# Patient Record
Sex: Female | Born: 1990 | Race: Black or African American | Hispanic: No | Marital: Single | Smoking: Never smoker
Health system: Southern US, Community
[De-identification: ages and names within clinical notes are randomized; demographics above are authoritative.]

## PROBLEM LIST (undated history)

## (undated) HISTORY — PX: HERNIA REPAIR: SHX51

---
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
## 2014-10-23 ENCOUNTER — Encounter (HOSPITAL_COMMUNITY): Payer: Self-pay | Admitting: *Deleted

## 2014-10-23 ENCOUNTER — Emergency Department (HOSPITAL_COMMUNITY): Payer: Commercial Managed Care - HMO

## 2014-10-23 ENCOUNTER — Emergency Department (HOSPITAL_COMMUNITY)
Admission: EM | Admit: 2014-10-23 | Discharge: 2014-10-24 | Disposition: A | Discharge status: Home / Self Care | Payer: Commercial Managed Care - HMO | Attending: Emergency Medicine | Admitting: Emergency Medicine

## 2014-10-23 DIAGNOSIS — R05 Cough: Secondary | ICD-10-CM | POA: Diagnosis not present

## 2014-10-23 DIAGNOSIS — R079 Chest pain, unspecified: Secondary | ICD-10-CM | POA: Diagnosis present

## 2014-10-23 DIAGNOSIS — R0789 Other chest pain: Secondary | ICD-10-CM | POA: Diagnosis not present

## 2014-10-23 NOTE — ED Provider Notes (Signed)
CSN: [id number]     Arrival date & time 10/23/14  2258 History   First MD Initiated Contact with Patient 10/23/14 2300     Chief Complaint  Patient presents with  . Chest Pain     (Consider location/radiation/quality/duration/timing/severity/associated sxs/prior Treatment) Patient is a 24 y.o. female presenting with chest pain. The history is provided by the patient.  Chest Pain Pain location:  Substernal area Pain quality: sharp   Pain severity:  Moderate Onset quality:  Gradual Duration:  1 day Timing:  Intermittent Progression:  Waxing and waning Chronicity:  New Context: breathing and movement   Relieved by:  Nothing Worsened by:  Coughing and movement Ineffective treatments:  None tried Associated symptoms: cough   Associated symptoms: no abdominal pain, no fever, no near-syncope and no syncope   Risk factors: no coronary artery disease, no diabetes mellitus, no prior DVT/PE, no smoking and no surgery     No past medical history on file. No past surgical history on file. No family history on file. Social History  Substance Use Topics  . Smoking status: Not on file  . Smokeless tobacco: Not on file  . Alcohol Use: Not on file   OB History    No data available     Review of Systems  Constitutional: Negative for fever.  Respiratory: Positive for cough.   Cardiovascular: Positive for chest pain. Negative for syncope and near-syncope.  Gastrointestinal: Negative for abdominal pain.  All other systems reviewed and are negative.     Allergies  Review of patient's allergies indicates not on file.  Home Medications   Prior to Admission medications   Not on File   There were no vitals taken for this visit. Physical Exam  Constitutional: She is oriented to person, place, and time. She appears well-developed and well-nourished.  Non-toxic appearance.  HENT:  Head: Normocephalic.  Right Ear: Tympanic membrane and external ear normal.  Left Ear: Tympanic  membrane and external ear normal.  Eyes: EOM and lids are normal. Pupils are equal, round, and reactive to light.  Neck: Normal range of motion. Neck supple. Carotid bruit is not present.  Cardiovascular: Normal rate, regular rhythm, normal heart sounds, intact distal pulses and normal pulses.   Pulmonary/Chest: Breath sounds normal. No respiratory distress. She exhibits tenderness. She exhibits no crepitus, no deformity and no retraction.    Abdominal: Soft. Bowel sounds are normal. There is no tenderness. There is no guarding.  Musculoskeletal: Normal range of motion.  Lymphadenopathy:       Head (right side): No submandibular adenopathy present.       Head (left side): No submandibular adenopathy present.    She has no cervical adenopathy.  Neurological: She is alert and oriented to person, place, and time. She has normal strength. No cranial nerve deficit or sensory deficit.  Skin: Skin is warm and dry.  Psychiatric: She has a normal mood and affect. Her speech is normal.  Nursing note and vitals reviewed.   ED Course  Procedures (including critical care time) Labs Review Labs Reviewed - No data to display  Imaging Review No results found. I have personally reviewed and evaluated these images and lab results as part of my medical decision-making.   EKG Interpretation None      MDM  Vital signs stable. EKG non-acute. Pain reproduced by range of motion exercises involving the pectoral muscles, as well as palpation of the left upper chest extending to the sternal border. 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 Suspect that the patient has a musculoskeletal chest pain. Prescription for Robaxin and ibuprofen given to the patient. I have given the patient a work excuse for couple of days in order for her to take some of her medications.    Final diagnoses:  Pain, chest wall    **I have  reviewed nursing notes, vital signs, and all appropriate lab and imaging results for this patient.Ivery Quale, PA-C 10/24/14 0115  Dione Booze, MD 10/24/14 [phone]

## 2014-10-23 NOTE — ED Notes (Signed)
Pt c/o mid sternal chest pain, described as sharp that has gotten worse today; pt states she also has a non-productive cough

## 2014-10-24 MED ORDER — IBUPROFEN 800 MG PO TABS: 800.0000 mg | ORAL_TABLET | Freq: Three times a day (TID) | ORAL | Status: DC

## 2014-10-24 MED ORDER — IBUPROFEN 800 MG PO TABS
800.0000 mg | ORAL_TABLET | Freq: Once | ORAL | Status: AC
  Administered 2014-10-24: 800 mg via ORAL
  Filled 2014-10-24: qty 1

## 2014-10-24 MED ORDER — ACETAMINOPHEN 325 MG PO TABS
650.0000 mg | ORAL_TABLET | Freq: Once | ORAL | Status: AC
  Administered 2014-10-24: 650 mg via ORAL
  Filled 2014-10-24: qty 2

## 2014-10-24 MED ORDER — METHOCARBAMOL 500 MG PO TABS
1000.0000 mg | ORAL_TABLET | Freq: Once | ORAL | Status: AC
  Administered 2014-10-24: 1000 mg via ORAL
  Filled 2014-10-24: qty 2

## 2014-10-24 MED ORDER — METHOCARBAMOL 500 MG PO TABS: 500.0000 mg | ORAL_TABLET | Freq: Three times a day (TID) | ORAL | Status: DC

## 2014-10-24 NOTE — Discharge Instructions (Signed)
Chest Wall Pain °Chest wall pain is pain felt in or around the chest bones and muscles. It may take up to 6 weeks to get better. It may take longer if you are active. Chest wall pain can happen on its own. Other times, things like germs, injury, coughing, or exercise can cause the pain. °HOME CARE  °· Avoid activities that make you tired or cause pain. Try not to use your chest, belly (abdominal), or side muscles. Do not use heavy weights. °· Put ice on the sore area. °¨ Put ice in a plastic bag. °¨ Place a towel between your skin and the bag. °¨ Leave the ice on for 15-20 minutes for the first 2 days. °· Only take medicine as told by your doctor. °GET HELP RIGHT AWAY IF:  °· You have more pain or are very uncomfortable. °· You have a fever. °· Your chest pain gets worse. °· You have new problems. °· You feel sick to your stomach (nauseous) or throw up (vomit). °· You start to sweat or feel lightheaded. °· You have a cough with mucus (phlegm). °· You cough up blood. °MAKE SURE YOU:  °· Understand these instructions. °· Will watch your condition. °· Will get help right away if you are not doing well or get worse. °Document Released: 07/01/2007 Document Revised: 04/06/2011 Document Reviewed: 09/08/2010 °[employer]® Patient Information ©2015 [employer]. This information is not intended to replace advice given to you by your health care provider. Make sure you discuss any questions you have with your health care provider. ° °

## 2014-10-24 NOTE — ED Notes (Signed)
Pt assisted to bathroom and back to bed 

## 2017-04-10 ENCOUNTER — Emergency Department (HOSPITAL_COMMUNITY)
Admission: EM | Admit: 2017-04-10 | Discharge: 2017-04-10 | Disposition: A | Discharge status: Home / Self Care | Payer: Commercial Managed Care - HMO | Attending: Emergency Medicine | Admitting: Emergency Medicine

## 2017-04-10 ENCOUNTER — Encounter (HOSPITAL_COMMUNITY): Payer: Self-pay

## 2017-04-10 DIAGNOSIS — L509 Urticaria, unspecified: Secondary | ICD-10-CM | POA: Insufficient documentation

## 2017-04-10 DIAGNOSIS — Z5321 Procedure and treatment not carried out due to patient leaving prior to being seen by health care provider: Secondary | ICD-10-CM | POA: Insufficient documentation

## 2017-04-10 NOTE — ED Notes (Signed)
Pt left the waiting area after another family member that was in the emergency department for treatment was also discharged.  Unable to locate pt at this time.

## 2017-04-10 NOTE — ED Triage Notes (Signed)
Pt with onset of hives to her chest, arms, and abd yesterday, states she does not know what might be causing it.  Pt took a benadryl earlier in the day.

## 2017-04-11 ENCOUNTER — Emergency Department (HOSPITAL_COMMUNITY)
Admission: EM | Admit: 2017-04-11 | Discharge: 2017-04-11 | Disposition: A | Discharge status: Home / Self Care | Payer: Self-pay

## 2017-04-11 NOTE — ED Notes (Signed)
Called no answer

## 2017-11-27 ENCOUNTER — Other Ambulatory Visit: Payer: Self-pay

## 2017-11-27 ENCOUNTER — Encounter (HOSPITAL_COMMUNITY): Payer: Self-pay | Admitting: Emergency Medicine

## 2017-11-27 ENCOUNTER — Emergency Department (HOSPITAL_COMMUNITY)
Admission: EM | Admit: 2017-11-27 | Discharge: 2017-11-27 | Disposition: A | Discharge status: Home / Self Care | Payer: Self-pay | Attending: Emergency Medicine | Admitting: Emergency Medicine

## 2017-11-27 DIAGNOSIS — Z79899 Other long term (current) drug therapy: Secondary | ICD-10-CM | POA: Insufficient documentation

## 2017-11-27 DIAGNOSIS — N390 Urinary tract infection, site not specified: Secondary | ICD-10-CM | POA: Insufficient documentation

## 2017-11-27 LAB — URINALYSIS, ROUTINE W REFLEX MICROSCOPIC
Bilirubin Urine: NEGATIVE
GLUCOSE, UA: NEGATIVE mg/dL
Hgb urine dipstick: NEGATIVE
Ketones, ur: NEGATIVE mg/dL
Nitrite: NEGATIVE
PROTEIN: NEGATIVE mg/dL
Specific Gravity, Urine: 1.019 (ref 1.005–1.030)
pH: 5 (ref 5.0–8.0)

## 2017-11-27 LAB — POC URINE PREG, ED: Preg Test, Ur: NEGATIVE

## 2017-11-27 MED ORDER — METHOCARBAMOL 500 MG PO TABS: 500.0000 mg | ORAL_TABLET | Freq: Two times a day (BID) | ORAL | 0 refills | Status: DC

## 2017-11-27 MED ORDER — CEPHALEXIN 500 MG PO CAPS: 500.0000 mg | ORAL_CAPSULE | Freq: Two times a day (BID) | ORAL | 0 refills | Status: DC

## 2017-11-27 NOTE — ED Triage Notes (Signed)
Patient c/o bilateral flank pain x1 week. Per patient pain started radiating into lower back this morning. Dysuria and frequency in urination reported. Denies any nausea, vomiting, diarrhea, fevers, or blood in urine. Denies any complications with BMs-last normal BM yesterday.

## 2017-11-27 NOTE — Discharge Instructions (Addendum)
Please read attached information. If you experience any new or worsening signs or symptoms please return to the emergency room for evaluation. Please follow-up with your primary care provider or specialist as discussed. Please use medication prescribed only as directed and discontinue taking if you have any concerning signs or symptoms.   °

## 2017-11-27 NOTE — ED Provider Notes (Signed)
[facility] EMERGENCY DEPARTMENT Provider Note   CSN: [id number] Arrival date & time: 11/27/17  1018     History   Chief Complaint Chief Complaint  Patient presents with  . Flank Pain    HPI Jenna Brennan is a 27 y.o. female.  HPI   27 year old female presents today with complaints of bilateral back and  flank pain.  Patient notes symptoms started early last week approximately 5 days ago with bilateral pain in her flank and lower back.  She notes symptoms are worse with palpation, worse with movement.  She denies any cough shortness of breath abdominal pain fever or abnormal bowel movements.  She notes she has had some increased urinary frequency recently with some dysuria.   History reviewed. No pertinent past medical history.  There are no active problems to display for this patient.   Past Surgical History:  Procedure Laterality Date  . HERNIA REPAIR       OB History    Gravida  0   Para  0   Term  0   Preterm  0   AB  0   Living  0     SAB  0   TAB  0   Ectopic  0   Multiple  0   Live Births  0            Home Medications    Prior to Admission medications   Medication Sig Start Date End Date Taking? Authorizing Provider  cephALEXin (KEFLEX) 500 MG capsule Take 1 capsule (500 mg total) by mouth 2 (two) times daily. 11/27/17   Maurissa Ambrose, Tinnie Gens, PA-C  ibuprofen (ADVIL,MOTRIN) 800 MG tablet Take 1 tablet (800 mg total) by mouth 3 (three) times daily. 10/24/14   Ivery Quale, PA-C  methocarbamol (ROBAXIN) 500 MG tablet Take 1 tablet (500 mg total) by mouth 2 (two) times daily. 11/27/17   Eyvonne Mechanic, PA-C    Family History History reviewed. No pertinent family history.  Social History Social History   Tobacco Use  . Smoking status: Never Smoker  . Smokeless tobacco: Never Used  Substance Use Topics  . Alcohol use: No  . Drug use: No     Allergies   Patient has no known allergies.   Review of Systems Review of Systems    All other systems reviewed and are negative.   Physical Exam Updated Vital Signs BP 124/84 (BP Location: Right Arm)   Pulse 94   Temp 99 F (37.2 C) (Oral)   Resp 16   Ht 5\' 6"  (1.676 m)   Wt 108.9 kg   LMP 11/02/2017   SpO2 100%   BMI 38.74 kg/m   Physical Exam  Constitutional: She is oriented to person, place, and time. She appears well-developed and well-nourished.  HENT:  Head: Normocephalic and atraumatic.  Eyes: Pupils are equal, round, and reactive to light. Conjunctivae are normal. Right eye exhibits no discharge. Left eye exhibits no discharge. No scleral icterus.  Neck: Normal range of motion. No JVD present. No tracheal deviation present.  Pulmonary/Chest: Effort normal. No stridor.  Abdominal: Soft. She exhibits no distension and no mass. There is no tenderness. There is no rebound and no guarding. No hernia.  Musculoskeletal:  Exquisite tenderness to palpation of bilateral mid thoracic to lower lumbar musculature and flanks, no rash-no midline tenderness  Neurological: She is alert and oriented to person, place, and time. Coordination normal.  Psychiatric: She has a normal mood and affect. Her behavior is  normal. Judgment and thought content normal.  Nursing note and vitals reviewed.    ED Treatments / Results  Labs (all labs ordered are listed, but only abnormal results are displayed) Labs Reviewed  URINALYSIS, ROUTINE W REFLEX MICROSCOPIC - Abnormal; Notable for the following components:      Result Value   Leukocytes, UA SMALL (*)    Bacteria, UA RARE (*)    All other components within normal limits  POC URINE PREG, ED    EKG None  Radiology No results found.  Procedures Procedures (including critical care time)  Medications Ordered in ED Medications - No data to display   Initial Impression / Assessment and Plan / ED Course  I have reviewed the triage vital signs and the nursing notes.  Pertinent labs & imaging results that were  available during my care of the patient were reviewed by me and considered in my medical decision making (see chart for details).     Discharge Meds: Flexeril, Robaxin  Assessment/Plan: 27 year old female presents today with likely muscular back pain.  She has exquisite tenderness to even light palpation of the thoracic to lumbar musculature.  She has no abdominal tenderness fever nausea or vomiting.  She has very minimal urinary symptoms with findings on urinalysis showing potential UTI.  Patient will be covered for uncomplicated UTI have very low suspicion for any signs of pyelonephritis.  If patient develops any new or worsening signs or symptoms she will return to emergency room for repeat evaluation.  She will take ibuprofen and Tylenol as needed for discomfort.    Final Clinical Impressions(s) / ED Diagnoses   Final diagnoses:  Urinary tract infection without hematuria, site unspecified    ED Discharge Orders         Ordered    cephALEXin (KEFLEX) 500 MG capsule  2 times daily     11/27/17 1137    methocarbamol (ROBAXIN) 500 MG tablet  2 times daily     11/27/17 1140           Eyvonne Mechanic, PA-C 11/27/17 1140    Bethann Berkshire, MD 11/27/17 1322

## 2017-12-13 ENCOUNTER — Emergency Department (HOSPITAL_COMMUNITY)
Admission: EM | Admit: 2017-12-13 | Discharge: 2017-12-13 | Disposition: A | Discharge status: Home / Self Care | Payer: Self-pay | Attending: Emergency Medicine | Admitting: Emergency Medicine

## 2017-12-13 ENCOUNTER — Emergency Department (HOSPITAL_COMMUNITY): Payer: Self-pay

## 2017-12-13 ENCOUNTER — Encounter (HOSPITAL_COMMUNITY): Payer: Self-pay | Admitting: Emergency Medicine

## 2017-12-13 ENCOUNTER — Other Ambulatory Visit: Payer: Self-pay

## 2017-12-13 DIAGNOSIS — K5900 Constipation, unspecified: Secondary | ICD-10-CM

## 2017-12-13 DIAGNOSIS — R072 Precordial pain: Secondary | ICD-10-CM

## 2017-12-13 LAB — HEPATIC FUNCTION PANEL
ALT: 18 U/L (ref 0–44)
AST: 21 U/L (ref 15–41)
Albumin: 4.2 g/dL (ref 3.5–5.0)
Alkaline Phosphatase: 83 U/L (ref 38–126)
Bilirubin, Direct: 0.1 mg/dL (ref 0.0–0.2)
Indirect Bilirubin: 0.5 mg/dL (ref 0.3–0.9)
TOTAL PROTEIN: 8.4 g/dL — AB (ref 6.5–8.1)
Total Bilirubin: 0.6 mg/dL (ref 0.3–1.2)

## 2017-12-13 LAB — LIPASE, BLOOD: Lipase: 42 U/L (ref 11–51)

## 2017-12-13 LAB — BASIC METABOLIC PANEL
Anion gap: 8 (ref 5–15)
BUN: 12 mg/dL (ref 6–20)
CO2: 23 mmol/L (ref 22–32)
Calcium: 9.2 mg/dL (ref 8.9–10.3)
Chloride: 106 mmol/L (ref 98–111)
Creatinine, Ser: 0.94 mg/dL (ref 0.44–1.00)
GFR calc Af Amer: >60 mL/min (ref >60)
GFR calc non Af Amer: >60 mL/min (ref >60)
Glucose, Bld: 114 mg/dL — ABNORMAL HIGH (ref 70–99)
POTASSIUM: 3.6 mmol/L (ref 3.5–5.1)
SODIUM: 137 mmol/L (ref 135–145)

## 2017-12-13 LAB — CBC
HEMATOCRIT: 43.4 % (ref 36.0–46.0)
Hemoglobin: 13.3 g/dL (ref 12.0–15.0)
MCH: 25.6 pg — ABNORMAL LOW (ref 26.0–34.0)
MCHC: 30.6 g/dL (ref 30.0–36.0)
MCV: 83.6 fL (ref 80.0–100.0)
NRBC: 0 % (ref 0.0–0.2)
Platelets: 399 10*3/uL (ref 150–400)
RBC: 5.19 MIL/uL — ABNORMAL HIGH (ref 3.87–5.11)
RDW: 15.3 % (ref 11.5–15.5)
WBC: 16.5 10*3/uL — AB (ref 4.0–10.5)

## 2017-12-13 LAB — POC URINE PREG, ED: PREG TEST UR: NEGATIVE

## 2017-12-13 LAB — TROPONIN I: Troponin I: <0.03 ng/mL (ref <0.03)

## 2017-12-13 MED ORDER — LIDOCAINE VISCOUS HCL 2 % MT SOLN
15.0000 mL | Freq: Once | OROMUCOSAL | Status: AC
  Administered 2017-12-13: 15 mL via ORAL
  Filled 2017-12-13: qty 15

## 2017-12-13 MED ORDER — POLYETHYLENE GLYCOL 3350 17 G PO PACK: 17.0000 g | PACK | Freq: Every day | ORAL | 0 refills | Status: DC

## 2017-12-13 MED ORDER — ALUM & MAG HYDROXIDE-SIMETH 200-200-20 MG/5ML PO SUSP
30.0000 mL | Freq: Once | ORAL | Status: AC
  Administered 2017-12-13: 30 mL via ORAL
  Filled 2017-12-13: qty 30

## 2017-12-13 MED ORDER — SENNOSIDES-DOCUSATE SODIUM 8.6-50 MG PO TABS: 1.0000 | ORAL_TABLET | Freq: Every evening | ORAL | 0 refills | Status: DC | PRN

## 2017-12-13 NOTE — ED Provider Notes (Signed)
Emergency Department Provider Note   I have reviewed the triage vital signs and the nursing notes.   HISTORY  Chief Complaint Chest Pain   HPI Jenna Brennan is a 27 y.o. female resents to the emergency department with chest and epigastric abdominal pain over the past month.  Patient reports symptoms worse with eating or lying flat.  Pain radiates to the back.  She is had some associated constipation which has not been issue for her in the past.  Patient tells me that she has been massaging her abdomen and drinking water with no relief and constipation.  No rectal or abdominal pain.  Denies any fevers or chills.  No productive cough or shortness of breath.   History reviewed. No pertinent past medical history.  There are no active problems to display for this patient.   Past Surgical History:  Procedure Laterality Date  . HERNIA REPAIR      Allergies Patient has no known allergies.  No family history on file.  Social History Social History   Tobacco Use  . Smoking status: Never Smoker  . Smokeless tobacco: Never Used  Substance Use Topics  . Alcohol use: No  . Drug use: No    Review of Systems  Constitutional: No fever/chills Eyes: No visual changes. ENT: No sore throat. Cardiovascular: Positive chest pain. Respiratory: Denies shortness of breath. Gastrointestinal: Positive epigastric abdominal pain.  No nausea, no vomiting.  No diarrhea. Positive constipation. Genitourinary: Negative for dysuria. Musculoskeletal: Negative for back pain. Skin: Negative for rash. Neurological: Negative for headaches, focal weakness or numbness.  10-point ROS otherwise negative.  ____________________________________________   PHYSICAL EXAM:  VITAL SIGNS: ED Triage Vitals  Enc Vitals Group     BP 12/13/17 1954 131/81     Pulse Rate 12/13/17 1954 85     Resp 12/13/17 1954 14     Temp 12/13/17 1954 98.5 F (36.9 C)     Temp Source 12/13/17 1954 Oral     SpO2  12/13/17 1954 95 %     Weight 12/13/17 1955 238 lb (108 kg)     Height 12/13/17 1955 5\' 6"  (1.676 m)     Pain Score 12/13/17 1955 8   Constitutional: Alert and oriented. Well appearing and in no acute distress. Eyes: Conjunctivae are normal.  Head: Atraumatic. Nose: No congestion/rhinnorhea. Mouth/Throat: Mucous membranes are moist. Neck: No stridor.   Cardiovascular: Normal rate, regular rhythm. Good peripheral circulation. Grossly normal heart sounds.   Respiratory: Normal respiratory effort.  No retractions. Lungs CTAB. Gastrointestinal: Soft and nontender. No distention.  Musculoskeletal: No lower extremity tenderness nor edema. No gross deformities of extremities. Neurologic:  Normal speech and language. No gross focal neurologic deficits are appreciated.  Skin:  Skin is warm, dry and intact. No rash noted.  ____________________________________________   LABS (all labs ordered are listed, but only abnormal results are displayed)  Labs Reviewed  BASIC METABOLIC PANEL - Abnormal; Notable for the following components:      Result Value   Glucose, Bld 114 (*)    All other components within normal limits  CBC - Abnormal; Notable for the following components:   WBC 16.5 (*)    RBC 5.19 (*)    MCH 25.6 (*)    All other components within normal limits  HEPATIC FUNCTION PANEL - Abnormal; Notable for the following components:   Total Protein 8.4 (*)    All other components within normal limits  TROPONIN I  LIPASE, BLOOD  POC  URINE PREG, ED   ____________________________________________  EKG   EKG Interpretation  Date/Time:  Monday December 13 2017 19:58:12 EST Ventricular Rate:  88 PR Interval:  178 QRS Duration: 80 QT Interval:  334 QTC Calculation: 404 R Axis:   49 Text Interpretation:  Normal sinus rhythm Normal ECG No STEMI.  Confirmed by Alona BeneLong, Thaddeus Evitts [id number]) on 12/14/2017 9:39:29 AM       ____________________________________________  RADIOLOGY  Dg Abdomen  Acute W/chest  Result Date: 12/13/2017 CLINICAL DATA:  Chest pain and epigastric pain for 1 month. No bowel movement for 2 weeks. EXAM: DG ABDOMEN ACUTE W/ 1V CHEST COMPARISON:  Chest radiograph October 18, 2017 FINDINGS: Cardiomediastinal silhouette is normal. Lungs are clear, no pleural effusions. No pneumothorax. Soft tissue planes and included osseous structures are unremarkable. Bowel gas pattern is nondilated and nonobstructive. Moderate volume retained large bowel stool. Phleboliths project in the pelvis. No intra-abdominal mass effect, pathologic calcifications or free air. Soft tissue planes and included osseous structures are non-suspicious. IMPRESSION: 1. Normal chest. 2. Moderate amount of retained large bowel stool, normal bowel gas pattern. Electronically Signed   By: Awilda Metroourtnay  Bloomer M.D.   On: 12/13/2017 22:11    ____________________________________________   PROCEDURES  Procedure(s) performed:   Procedures  None ____________________________________________   INITIAL IMPRESSION / ASSESSMENT AND PLAN / ED COURSE  Pertinent labs & imaging results that were available during my care of the patient were reviewed by me and considered in my medical decision making (see chart for details).  Patient presents to the emergency department with atypical chest and epigastric abdominal pain radiating to the back.  Suspect GI etiology of symptoms.  Patient has no right upper quadrant tenderness or peritoneal signs.  Remainder of the abdomen is completely nontender to palpation.  Symptoms have been ongoing for the past month.  Labs from triage reviewed.  Plan to add on hepatic and biliary labs as well as plain film of the chest and abdomen.   Biliary labs are normal. Plain film reviewed and correlates with my expectation clinically. No acute distress. Re-exam of the abdomen remains benign. Plan for discharge home with constipation treatment and PCP follow up.   At this time, I do not  feel there is any life-threatening condition present. I have reviewed and discussed all results (EKG, imaging, lab, urine as appropriate), exam findings with patient. I have reviewed nursing notes and appropriate previous records.  I feel the patient is safe to be discharged home without further emergent workup. Discussed usual and customary return precautions. Patient and family (if present) verbalize understanding and are comfortable with this plan.  Patient will follow-up with their primary care provider. If they do not have a primary care provider, information for follow-up has been provided to them. All questions have been answered.  ____________________________________________  FINAL CLINICAL IMPRESSION(S) / ED DIAGNOSES  Final diagnoses:  Precordial chest pain  Constipation, unspecified constipation type     MEDICATIONS GIVEN DURING THIS VISIT:  Medications  alum & mag hydroxide-simeth (MAALOX/MYLANTA) 200-200-20 MG/5ML suspension 30 mL (30 mLs Oral Given 12/13/17 2156)    And  lidocaine (XYLOCAINE) 2 % viscous mouth solution 15 mL (15 mLs Oral Given 12/13/17 2156)     NEW OUTPATIENT MEDICATIONS STARTED DURING THIS VISIT:  Discharge Medication List as of 12/13/2017 10:54 PM    START taking these medications   Details  polyethylene glycol (MIRALAX) packet Take 17 g by mouth daily., Starting Mon 12/13/2017, Print    senna-docusate (SENOKOT-S) 8.6-50 MG  tablet Take 1 tablet by mouth at bedtime as needed for moderate constipation., Starting Mon 12/13/2017, Print        Note:  This document was prepared using Dragon voice recognition software and may include unintentional dictation errors.  Alona Bene, MD Emergency Medicine    Zaynab Chipman, Arlyss Repress, MD 12/14/17 [phone]

## 2017-12-13 NOTE — Discharge Instructions (Signed)
You were seen in the emergency department today for constipation.  We recommend that you use one or more of the following over-the-counter medications in the order described: °  °1)  Colace (or Dulcolax) 100 mg:  This is a stool softener, and you may take it once or twice a day as needed. °2)  Senna tablets:  This is a bowel stimulant that will help "push" out your stool. It is the next step to add after you have tried a stool softener. °3)  Miralax (powder):  This medication works by drawing additional fluid into your intestines and helps to flush out your stool.  Mix the powder with water or juice according to label instructions.  It may help if the Colace and Senna are not sufficient, but you must be sure to use the recommended amount of water or juice when you mix up the powder. °Remember that narcotic pain medications are constipating, so avoid them or minimize their use.  Drink plenty of fluids. ° °Please return to the Emergency Department immediately if you develop new or worsening symptoms that concern you, such as (but not limited to) fever > 101 degrees, severe abdominal pain, or persistent vomiting. ° ° °Constipation °Constipation is when a person has fewer than three bowel movements a week, has difficulty having a bowel movement, or has stools that are dry, hard, or larger than normal. As people grow older, constipation is more common. If you try to fix constipation with medicines that make you have a bowel movement (laxatives), the problem may get worse. Rutha Melgoza-term laxative use may cause the muscles of the colon to become weak. A low-fiber diet, not taking in enough fluids, and taking certain medicines may make constipation worse.  °CAUSES  °Certain medicines, such as antidepressants, pain medicine, iron supplements, antacids, and water pills.   °Certain diseases, such as diabetes, irritable bowel syndrome (IBS), thyroid disease, or depression.   °Not drinking enough water.   °Not eating enough  fiber-rich foods.   °Stress or travel.   °Lack of physical activity or exercise.   °Ignoring the urge to have a bowel movement.   °Using laxatives too much.   °SIGNS AND SYMPTOMS  °Having fewer than three bowel movements a week.   °Straining to have a bowel movement.   °Having stools that are hard, dry, or larger than normal.   °Feeling full or bloated.   °Pain in the lower abdomen.   °Not feeling relief after having a bowel movement.   °DIAGNOSIS  °Your health care provider will take a medical history and perform a physical exam. Further testing may be done for severe constipation. Some tests may include: °A barium enema X-ray to examine your rectum, colon, and, sometimes, your small intestine.   °A sigmoidoscopy to examine your lower colon.   °A colonoscopy to examine your entire colon. °TREATMENT  °Treatment will depend on the severity of your constipation and what is causing it. Some dietary treatments include drinking more fluids and eating more fiber-rich foods. Lifestyle treatments may include regular exercise. If these diet and lifestyle recommendations do not help, your health care provider may recommend taking over-the-counter laxative medicines to help you have bowel movements. Prescription medicines may be prescribed if over-the-counter medicines do not work.  °HOME CARE INSTRUCTIONS  °Eat foods that have a lot of fiber, such as fruits, vegetables, whole grains, and beans. °Limit foods high in fat and processed sugars, such as french fries, hamburgers, cookies, candies, and soda.   °A   fiber supplement may be added to your diet if you cannot get enough fiber from foods.   °Drink enough fluids to keep your urine clear or pale yellow.   °Exercise regularly or as directed by your health care provider.   °Go to the restroom when you have the urge to go. Do not hold it.   °Only take over-the-counter or prescription medicines as directed by your health care provider. Do not take other medicines for constipation  without talking to your health care provider first.   °SEEK IMMEDIATE MEDICAL CARE IF:  °You have bright red blood in your stool.   °Your constipation lasts for more than 4 days or gets worse.   °You have abdominal or rectal pain.   °You have thin, pencil-like stools.   °You have unexplained weight loss. °MAKE SURE YOU:  °Understand these instructions. °Will watch your condition. °Will get help right away if you are not doing well or get worse. °Document Released: 10/11/2003 Document Revised: 01/17/2013 Document Reviewed: 10/24/2012 °[employer]® Patient Information ©2015 [employer]. This information is not intended to replace advice given to you by your health care provider. Make sure you discuss any questions you have with your health care provider. ° ° ° °

## 2017-12-13 NOTE — ED Triage Notes (Signed)
Pt C/O chest pain that started 1 month ago. Pt states it hurts worse after eating and lying flat. Pt also C/O mid back pain. Pt states she does not take birth control. Pt also C/O "not being able to have a bowel movement normally." Pt states last BM was 2 weeks ago.

## 2017-12-13 NOTE — ED Notes (Signed)
Pt ambulatory to waiting room. Pt verbalized understanding of discharge instructions.   

## 2018-02-27 ENCOUNTER — Emergency Department (HOSPITAL_COMMUNITY)
Admission: EM | Admit: 2018-02-27 | Discharge: 2018-02-27 | Disposition: A | Discharge status: Home / Self Care | Payer: Self-pay | Attending: Emergency Medicine | Admitting: Emergency Medicine

## 2018-02-27 ENCOUNTER — Emergency Department (HOSPITAL_COMMUNITY): Payer: Self-pay

## 2018-02-27 ENCOUNTER — Other Ambulatory Visit: Payer: Self-pay

## 2018-02-27 ENCOUNTER — Encounter (HOSPITAL_COMMUNITY): Payer: Self-pay | Admitting: *Deleted

## 2018-02-27 DIAGNOSIS — K0510 Chronic gingivitis, plaque induced: Secondary | ICD-10-CM

## 2018-02-27 DIAGNOSIS — R0789 Other chest pain: Secondary | ICD-10-CM

## 2018-02-27 DIAGNOSIS — K0501 Acute gingivitis, non-plaque induced: Secondary | ICD-10-CM | POA: Insufficient documentation

## 2018-02-27 LAB — TROPONIN I: Troponin I: <0.03 ng/mL (ref <0.03)

## 2018-02-27 MED ORDER — AMOXICILLIN 250 MG PO CAPS
500.0000 mg | ORAL_CAPSULE | Freq: Once | ORAL | Status: AC
  Administered 2018-02-27: 500 mg via ORAL
  Filled 2018-02-27: qty 2

## 2018-02-27 MED ORDER — AMOXICILLIN 500 MG PO CAPS: 500.0000 mg | ORAL_CAPSULE | Freq: Three times a day (TID) | ORAL | 0 refills | Status: DC

## 2018-02-27 NOTE — ED Provider Notes (Signed)
[facility] EMERGENCY DEPARTMENT Provider Note   CSN: [id number] Arrival date & time: 02/27/18  0606    History   Chief Complaint Chief Complaint  Patient presents with  . Chest Pain    HPI Jenna Brennan is a 28 y.o. female.  HPI  28 year old female, she has no past medical history, she presents today with 2 complaints, first she has had chest pain for the last 2 days which she describes as global chest pain, nonfocal, not positional and not associated with deep breathing or exertion.  She reports that it is been present for over 48 hours without any relief.  She had similar symptoms a couple of months ago when she was seen in the emergency department at that time as well.  She has no other medical history, she has no cardiac history she does not smoke cigarettes and she has no risk factors for pulmonary embolism.  She denies any exertional symptoms.  She feels like the pain is radiating up into her mouth today which is why she came to the hospital.  She has not had any medicine for this prior to arrival.  She states it hurts to open her mouth  History reviewed. No pertinent past medical history.  There are no active problems to display for this patient.   Past Surgical History:  Procedure Laterality Date  . HERNIA REPAIR       OB History    Gravida  0   Para  0   Term  0   Preterm  0   AB  0   Living  0     SAB  0   TAB  0   Ectopic  0   Multiple  0   Live Births  0            Home Medications    Prior to Admission medications   Medication Sig Start Date End Date Taking? Authorizing Provider  amoxicillin (AMOXIL) 500 MG capsule Take 1 capsule (500 mg total) by mouth 3 (three) times daily. 02/27/18   Eber Hong, MD  cephALEXin (KEFLEX) 500 MG capsule Take 1 capsule (500 mg total) by mouth 2 (two) times daily. 11/27/17   Hedges, Tinnie Gens, PA-C  ibuprofen (ADVIL,MOTRIN) 800 MG tablet Take 1 tablet (800 mg total) by mouth 3 (three) times daily. 10/24/14    Ivery Quale, PA-C  methocarbamol (ROBAXIN) 500 MG tablet Take 1 tablet (500 mg total) by mouth 2 (two) times daily. 11/27/17   Hedges, Tinnie Gens, PA-C  polyethylene glycol (MIRALAX) packet Take 17 g by mouth daily. 12/13/17   Long, Arlyss Repress, MD  senna-docusate (SENOKOT-S) 8.6-50 MG tablet Take 1 tablet by mouth at bedtime as needed for moderate constipation. 12/13/17   Long, Arlyss Repress, MD    Family History No family history on file.  Social History Social History   Tobacco Use  . Smoking status: Never Smoker  . Smokeless tobacco: Never Used  Substance Use Topics  . Alcohol use: No  . Drug use: No     Allergies   Patient has no known allergies.   Review of Systems Review of Systems  All other systems reviewed and are negative.    Physical Exam Updated Vital Signs BP 124/80   Pulse 97   Temp 98.3 F (36.8 C) (Oral)   Resp 20   Ht 1.676 m (5\' 6" )   Wt 105.2 kg   LMP 02/20/2018   SpO2 100%   BMI 37.45 kg/m  Physical Exam Vitals signs and nursing note reviewed.  Constitutional:      General: She is not in acute distress.    Appearance: She is well-developed.  HENT:     Head: Normocephalic and atraumatic.     Mouth/Throat:     Pharynx: No oropharyngeal exudate.     Comments: Dentition is overall in good repair except for the left upper first premolar which has a fracture and partially exposed root.  Her gingiva are tender across the upper gumline without any focal swelling.  There is no trismus or torticollis, no pain with tongue protrusion, no lymphadenopathy about the jaw or the neck Eyes:     General: No scleral icterus.       Right eye: No discharge.        Left eye: No discharge.     Conjunctiva/sclera: Conjunctivae normal.     Pupils: Pupils are equal, round, and reactive to light.  Neck:     Musculoskeletal: Normal range of motion and neck supple.     Thyroid: No thyromegaly.     Vascular: No JVD.  Cardiovascular:     Rate and Rhythm: Normal rate  and regular rhythm.     Heart sounds: Normal heart sounds. No murmur. No friction rub. No gallop.      Comments: There is no tenderness over the chest wall, there is no murmurs rubs or gallops, normal pulses at the radial arteries bilaterally Pulmonary:     Effort: Pulmonary effort is normal. No respiratory distress.     Breath sounds: Normal breath sounds. No wheezing or rales.  Abdominal:     General: Bowel sounds are normal. There is no distension.     Palpations: Abdomen is soft. There is no mass.     Tenderness: There is no abdominal tenderness.  Musculoskeletal: Normal range of motion.        General: No tenderness.     Right lower leg: No edema.     Left lower leg: No edema.  Lymphadenopathy:     Cervical: No cervical adenopathy.  Skin:    General: Skin is warm and dry.     Findings: No erythema or rash.  Neurological:     Mental Status: She is alert.     Coordination: Coordination normal.  Psychiatric:        Behavior: Behavior normal.      ED Treatments / Results  Labs (all labs ordered are listed, but only abnormal results are displayed) Labs Reviewed  TROPONIN I    EKG EKG Interpretation  Date/Time:  Sunday February 27 2018 06:25:35 EST Ventricular Rate:  96 PR Interval:    QRS Duration: 80 QT Interval:  317 QTC Calculation: 401 R Axis:   47 Text Interpretation:  Sinus rhythm Normal ECG since last tracing no significant change Confirmed by Eber Hong ([id number]) on 02/27/2018 6:56:08 AM   Radiology Dg Chest 2 View  Result Date: 02/27/2018 CLINICAL DATA:  Chest pain. EXAM: CHEST - 2 VIEW COMPARISON:  Radiographs of December 13, 2017. FINDINGS: The heart size and mediastinal contours are within normal limits. Both lungs are clear. No pneumothorax or pleural effusion is noted. The visualized skeletal structures are unremarkable. IMPRESSION: No active cardiopulmonary disease. Electronically Signed   By: Lupita Raider, M.D.   On: 02/27/2018 08:14     Procedures Procedures (including critical care time)  Medications Ordered in ED Medications  amoxicillin (AMOXIL) capsule 500 mg (500 mg Oral Given 02/27/18 0719)  Initial Impression / Assessment and Plan / ED Course  I have reviewed the triage vital signs and the nursing notes.  Pertinent labs & imaging results that were available during my care of the patient were reviewed by me and considered in my medical decision making (see chart for details).  Clinical Course as of Feb 28 855  Sun Feb 27, 2018  [id number] Chest xray normal - I have seen and interpreted this and there is no signs of PTX, PNA or other abnormalities.   [BM]  [id number] Troponin negative, chest x-ray unremarkable, home with amoxicillin   [BM]    Clinical Course User Index [BM] Eber HongMiller, Tineshia Becraft, MD    EKG is normal, the patient has reproducible tenderness over her gingiva though there is no focal abscess I suspect that there is an early periodontal infection given her deeply fractured tooth.  She will need referral to a dentist, amoxicillin, will obtain one troponin given 48 hours of symptoms and a chest x-ray otherwise the patient appears well with unremarkable vital signs and a very unremarkable exam.  Final Clinical Impressions(s) / ED Diagnoses   Final diagnoses:  Gingivitis  Dull chest pain    ED Discharge Orders         Ordered    amoxicillin (AMOXIL) 500 MG capsule  3 times daily     02/27/18 0855           Eber HongMiller, Jiselle Sheu, MD 02/27/18 [phone]

## 2018-02-27 NOTE — Discharge Instructions (Signed)
Amoxicillin 3 times daily for 10 days Motrin for pain See your doctor if no improvement Your heart seems normal today and is not causing your symptoms.  Your caregiver has diagnosed you as having chest pain that is nonspecific for one problem. This means that after looking at you and examining you and ordering tests (such as blood work, chest x-rays and EKG), your caregiver does not believe that the problem is serious enough to need watching in the hospital. This judgment is often made after testing shows no acute heart attack and you are at low risk for sudden acute heart condition. Chest pain comes from many different causes.  Seek immediate medical attention if:  You have severe chest pain, especially if the pain is crushing or pressure-like and spreads to the arms, back, neck, or jaw, or if you have sweating, nausea, shortness of breath. This is an emergency. Don't wait to see if the pain will go away. Get medical help at once. Call 911 immediately. Do not drive herself to the hospital.  Your chest pain gets worse and does not go away with rest.  You have an attack of chest pain lasting longer than usual, despite rest and treatment with the medications your caregiver has prescribed  You awaken from sleep with chest pain or shortness of breath.  You feel faint or dizzy  You have chest pain not typical of your usual pain for which you originally saw your caregiver.  [facility] Primary Care Doctor List    Kari Baars MD. Specialty: Pulmonary Disease Contact information: [address]  [address]  [place] [address]  [phone]   Syliva Overman, MD. Specialty: [facility] Medicine Contact information: [address]  [place] [address]  [phone]   Lilyan Punt, MD. Specialty: [facility] Medicine Contact information: [address]  [place] [address]  [phone]   Avon Gully, MD Specialty: Internal Medicine Contact information: [address] [place] [address]  [phone]   Catalina Pizza, MD. Specialty: Internal Medicine Contact information: [address]  [place] [address]  [phone]    [facility] (Dr. Selena Batten) Specialty: Family Medicine Contact information: [address]  [place] [address]  [phone]   John Giovanni, MD. Specialty: [facility] Medicine Contact information: [address]  [address]  [place] [address]  [phone]   Carylon Perches, MD. Specialty: Internal Medicine Contact information: [address]  [address]  [place] [address]  [phone]    [facility]  [address] [place], [place] [address] [phone]  Services [facility] offers a variety of basic health services.  Services include but are not limited to: Blood pressure checks  Heart rate checks  Blood sugar checks  Urine analysis  Rapid strep tests  Pregnancy tests.  Health education and referrals  People needing more complex services will be directed to a physician online. Using these virtual visits, doctors can evaluate and prescribe medicine and treatments. There will be no medication on-site, though [facility] will help patients fill their prescriptions at little to no cost.   For More information please go to: [email or web address]

## 2018-02-27 NOTE — ED Triage Notes (Signed)
Pt c/o chest pain that radiates to mouth area that started yesterday, pt reports that she had chest pain like this two months ago as well

## 2018-10-25 ENCOUNTER — Encounter: Payer: Self-pay | Admitting: Family Medicine

## 2018-10-25 ENCOUNTER — Other Ambulatory Visit: Payer: Self-pay

## 2018-10-25 ENCOUNTER — Ambulatory Visit (INDEPENDENT_AMBULATORY_CARE_PROVIDER_SITE_OTHER): Payer: Self-pay | Admitting: Family Medicine

## 2018-10-25 VITALS — BP 113/80 | HR 78 | Temp 98.6°F | Ht 66.0 in | Wt 266.0 lb

## 2018-10-25 DIAGNOSIS — Z119 Encounter for screening for infectious and parasitic diseases, unspecified: Secondary | ICD-10-CM

## 2018-10-25 DIAGNOSIS — R748 Abnormal levels of other serum enzymes: Secondary | ICD-10-CM

## 2018-10-25 DIAGNOSIS — M549 Dorsalgia, unspecified: Secondary | ICD-10-CM

## 2018-10-25 DIAGNOSIS — R079 Chest pain, unspecified: Secondary | ICD-10-CM

## 2018-10-25 DIAGNOSIS — K219 Gastro-esophageal reflux disease without esophagitis: Secondary | ICD-10-CM

## 2018-10-25 DIAGNOSIS — R1013 Epigastric pain: Secondary | ICD-10-CM

## 2018-10-25 DIAGNOSIS — G8929 Other chronic pain: Secondary | ICD-10-CM

## 2018-10-25 LAB — POCT URINALYSIS DIP (MANUAL ENTRY)
Bilirubin, UA: NEGATIVE
Blood, UA: NEGATIVE
Glucose, UA: NEGATIVE mg/dL
Ketones, POC UA: NEGATIVE mg/dL
Nitrite, UA: NEGATIVE
Protein Ur, POC: NEGATIVE mg/dL
Spec Grav, UA: 1.02 (ref 1.010–1.025)
Urobilinogen, UA: 0.2 E.U./dL
pH, UA: 7 (ref 5.0–8.0)

## 2018-10-25 MED ORDER — OMEPRAZOLE 20 MG PO CPDR: 20.0000 mg | DELAYED_RELEASE_CAPSULE | Freq: Two times a day (BID) | ORAL | 3 refills | Status: DC

## 2018-10-25 NOTE — Progress Notes (Signed)
9/29/202011:18 AM  Jenna Brennan [birth year], 28 y.o., female [id number]  Chief Complaint  Patient presents with  . Pain    stomach , chest and back for the past 6 mos.    HPI:   Patient is a 28 y.o. female who presents today with several concerns  Chest pain, sometimes very intense, random, self resolves, lasts about 10-30 minutes, stabbing pressure almost daily for past several months, not triggered by exertion No fhx CAD Patient does not smoke Only once a/w nausea and diaphoresis Mid sternal, upper chest, travels to her stomach causing epigastric pain  Stomach pain, epigastric pain, feels like severe cramps, radiates to her back, no nausea or vomiting, she does get bloated and gassy, she does report acid bash, no changes to bowel movements however BM range from loose to constipation. Has daily BM. BM does not relive the pain, denies any black tarry stools or blood in stool. Sometimes a/w with anorexia. No dysphagia. No sign weight loss. She has not tried dietary changes, eats once a day, picky eater Her MGM died stomach cancer, no fhx IBD Has not tried any GI meds, will take APAP or advil if pain severe enough Has not had any workup    Depression screen [facility] 2/9 10/25/2018 10/25/2018  Decreased Interest 0 0  Down, Depressed, Hopeless 0 0  PHQ - 2 Score 0 0    Fall Risk  10/25/2018 10/25/2018  Falls in the past year? 0 0  Number falls in past yr: 0 0  Injury with Fall? 0 0     No Known Allergies  Prior to Admission medications   Not on File    History reviewed. No pertinent past medical history.  Past Surgical History:  Procedure Laterality Date  . HERNIA REPAIR      Social History   Tobacco Use  . Smoking status: Never Smoker  . Smokeless tobacco: Never Used  Substance Use Topics  . Alcohol use: No    History reviewed. No pertinent family history.  ROS Per hpi  OBJECTIVE:  Today's Vitals   10/25/18 1054  BP: 113/80  Pulse: 78  Temp: 98.6 F  (37 C)  SpO2: 100%  Weight: 266 lb (120.7 kg)  Height: 5\' 6"  (1.676 m)   Body mass index is 42.93 kg/m.   Physical Exam Vitals signs and nursing note reviewed.  Constitutional:      Appearance: She is well-developed.  HENT:     Head: Normocephalic and atraumatic.     Mouth/Throat:     Pharynx: No oropharyngeal exudate.  Eyes:     General: No scleral icterus.    Conjunctiva/sclera: Conjunctivae normal.     Pupils: Pupils are equal, round, and reactive to light.  Neck:     Musculoskeletal: Neck supple.     Thyroid: No thyroid mass or thyromegaly.  Cardiovascular:     Rate and Rhythm: Normal rate and regular rhythm.     Heart sounds: Normal heart sounds. No murmur. No friction rub. No gallop.   Pulmonary:     Effort: Pulmonary effort is normal.     Breath sounds: Normal breath sounds. No wheezing or rales.  Abdominal:     General: Abdomen is protuberant. Bowel sounds are normal.     Palpations: Abdomen is soft. There is no hepatomegaly, splenomegaly or mass.     Tenderness: There is abdominal tenderness in the suprapubic area and left upper quadrant. There is no guarding or rebound.     Hernia:  No hernia is present.  Lymphadenopathy:     Cervical: No cervical adenopathy.  Skin:    General: Skin is warm and dry.  Neurological:     Mental Status: She is alert and oriented to person, place, and time.     Results for orders placed or performed in visit on 10/25/18 (from the past 24 hour(s))  POCT urinalysis dipstick     Status: Abnormal   Collection Time: 10/25/18 11:06 AM  Result Value Ref Range   Color, UA yellow yellow   Clarity, UA clear clear   Glucose, UA negative negative mg/dL   Bilirubin, UA negative negative   Ketones, POC UA negative negative mg/dL   Spec Grav, UA 1.020 1.010 - 1.025   Blood, UA negative negative   pH, UA 7.0 5.0 - 8.0   Protein Ur, POC negative negative mg/dL   Urobilinogen, UA 0.2 0.2 or 1.0 E.U./dL   Nitrite, UA Negative Negative    Leukocytes, UA Trace (A) Negative    No results found.  My interpretation of EKG:  NSR, HR 70, no st changes  ASSESSMENT and PLAN  1. Gastroesophageal reflux disease, esophagitis presence not specified Exam and EKG reassuring. History suggestive of GERD, discussed LFM, starting PPI, labs to r/o organic causes.   2. Chest pain, unspecified type - EKG 12-Lead  3. Abdominal pain, chronic, epigastric - CBC - Comprehensive metabolic panel - Lipase - Amylase  4. Back pain, unspecified back location, unspecified back pain laterality, unspecified chronicity - POCT urinalysis dipstick  5. Screening examination for infectious disease - HIV Antibody (routine testing w rflx)  Other orders - omeprazole (PRILOSEC) 20 MG capsule; Take 1 capsule (20 mg total) by mouth 2 (two) times daily before a meal.  Return in about 4 weeks (around 11/22/2018).    Rutherford Guys, MD Primary Care at [facility] [place], [place] [address] Ph.  [phone] Fax [phone]

## 2018-10-25 NOTE — Patient Instructions (Addendum)
   If you have lab work done today you will be contacted with your lab results within the next 2 weeks.  If you have not heard from us then please contact us. The fastest way to get your results is to register for My Chart.   IF you received an x-ray today, you will receive an invoice from [facility]. Please contact [facility] at [phone] with questions or concerns regarding your invoice.   IF you received labwork today, you will receive an invoice from [facility]. Please contact [facility] at [phone] with questions or concerns regarding your invoice.   Our billing staff will not be able to assist you with questions regarding bills from these companies.  You will be contacted with the lab results as soon as they are available. The fastest way to get your results is to activate your My Chart account. Instructions are located on the last page of this paperwork. If you have not heard from us regarding the results in 2 weeks, please contact this office.     Food Choices for Gastroesophageal Reflux Disease, Adult When you have gastroesophageal reflux disease (GERD), the foods you eat and your eating habits are very important. Choosing the right foods can help ease the discomfort of GERD. Consider working with a diet and nutrition specialist (dietitian) to help you make healthy food choices. What general guidelines should I follow?  Eating plan  Choose healthy foods low in fat, such as fruits, vegetables, whole grains, low-fat dairy products, and lean meat, fish, and poultry.  Eat frequent, small meals instead of three large meals each day. Eat your meals slowly, in a relaxed setting. Avoid bending over or lying down until 2-3 hours after eating.  Limit high-fat foods such as fatty meats or fried foods.  Limit your intake of oils, butter, and shortening to less than 8 teaspoons each day.  Avoid the following: ? Foods that cause symptoms. These may be different for  different people. Keep a food diary to keep track of foods that cause symptoms. ? Alcohol. ? Drinking large amounts of liquid with meals. ? Eating meals during the 2-3 hours before bed.  Cook foods using methods other than frying. This may include baking, grilling, or broiling. Lifestyle  Maintain a healthy weight. Ask your health care provider what weight is healthy for you. If you need to lose weight, work with your health care provider to do so safely.  Exercise for at least 30 minutes on 5 or more days each week, or as told by your health care provider.  Avoid wearing clothes that fit tightly around your waist and chest.  Do not use any products that contain nicotine or tobacco, such as cigarettes and e-cigarettes. If you need help quitting, ask your health care provider.  Sleep with the head of your bed raised. Use a wedge under the mattress or blocks under the bed frame to raise the head of the bed. What foods are not recommended? The items listed may not be a complete list. Talk with your dietitian about what dietary choices are best for you. Grains Pastries or quick breads with added fat. French toast. Vegetables Deep fried vegetables. French fries. Any vegetables prepared with added fat. Any vegetables that cause symptoms. For some people this may include tomatoes and tomato products, chili peppers, onions and garlic, and horseradish. Fruits Any fruits prepared with added fat. Any fruits that cause symptoms. For some people this may include citrus fruits, such as oranges,   grapefruit, pineapple, and lemons. Meats and other protein foods High-fat meats, such as fatty beef or pork, hot dogs, ribs, ham, sausage, salami and bacon. Fried meat or protein, including fried fish and fried chicken. Nuts and nut butters. Dairy Whole milk and chocolate milk. Sour cream. Cream. Ice cream. Cream cheese. Milk shakes. Beverages Coffee and tea, with or without caffeine. Carbonated beverages.  Sodas. Energy drinks. Fruit juice made with acidic fruits (such as orange or grapefruit). Tomato juice. Alcoholic drinks. Fats and oils Butter. Margarine. Shortening. Ghee. Sweets and desserts Chocolate and cocoa. Donuts. Seasoning and other foods Pepper. Peppermint and spearmint. Any condiments, herbs, or seasonings that cause symptoms. For some people, this may include curry, hot sauce, or vinegar-based salad dressings. Summary  When you have gastroesophageal reflux disease (GERD), food and lifestyle choices are very important to help ease the discomfort of GERD.  Eat frequent, small meals instead of three large meals each day. Eat your meals slowly, in a relaxed setting. Avoid bending over or lying down until 2-3 hours after eating.  Limit high-fat foods such as fatty meat or fried foods. This information is not intended to replace advice given to you by your health care provider. Make sure you discuss any questions you have with your health care provider. Document Released: 01/12/2005 Document Revised: 05/05/2018 Document Reviewed: 01/14/2016 Elsevier Patient Education  2020 [employer] Inc.  

## 2018-10-26 LAB — COMPREHENSIVE METABOLIC PANEL
ALT: 22 IU/L (ref 0–32)
AST: 18 IU/L (ref 0–40)
Albumin/Globulin Ratio: 1.6 (ref 1.2–2.2)
Albumin: 4.2 g/dL (ref 3.9–5.0)
Alkaline Phosphatase: 108 IU/L (ref 39–117)
BUN/Creatinine Ratio: 10 (ref 9–23)
BUN: 9 mg/dL (ref 6–20)
Bilirubin Total: 0.2 mg/dL (ref 0.0–1.2)
CO2: 21 mmol/L (ref 20–29)
Calcium: 9.3 mg/dL (ref 8.7–10.2)
Chloride: 106 mmol/L (ref 96–106)
Creatinine, Ser: 0.92 mg/dL (ref 0.57–1.00)
GFR calc Af Amer: 98 mL/min/{1.73_m2} (ref >59)
GFR calc non Af Amer: 85 mL/min/{1.73_m2} (ref >59)
Globulin, Total: 2.6 g/dL (ref 1.5–4.5)
Glucose: 88 mg/dL (ref 65–99)
Potassium: 4.6 mmol/L (ref 3.5–5.2)
Sodium: 140 mmol/L (ref 134–144)
Total Protein: 6.8 g/dL (ref 6.0–8.5)

## 2018-10-26 LAB — CBC
Hematocrit: 41.4 % (ref 34.0–46.6)
Hemoglobin: 13.1 g/dL (ref 11.1–15.9)
MCH: 25.4 pg — ABNORMAL LOW (ref 26.6–33.0)
MCHC: 31.6 g/dL (ref 31.5–35.7)
MCV: 80 fL (ref 79–97)
Platelets: 439 10*3/uL (ref 150–450)
RBC: 5.16 x10E6/uL (ref 3.77–5.28)
RDW: 14.2 % (ref 11.7–15.4)
WBC: 11.6 10*3/uL — ABNORMAL HIGH (ref 3.4–10.8)

## 2018-10-26 LAB — LIPASE: Lipase: 94 U/L — ABNORMAL HIGH (ref 14–72)

## 2018-10-26 LAB — AMYLASE: Amylase: 111 U/L — ABNORMAL HIGH (ref 31–110)

## 2018-10-26 LAB — HIV ANTIBODY (ROUTINE TESTING W REFLEX): HIV Screen 4th Generation wRfx: NONREACTIVE

## 2018-11-07 NOTE — Addendum Note (Signed)
Addended by: Rutherford Guys on: 11/07/2018 01:08 PM   Modules accepted: Orders

## 2018-11-15 ENCOUNTER — Ambulatory Visit
Admission: RE | Admit: 2018-11-15 | Discharge: 2018-11-15 | Disposition: A | Discharge status: Home / Self Care | Payer: Self-pay | Source: Ambulatory Visit | Attending: Family Medicine | Admitting: Family Medicine

## 2018-11-15 ENCOUNTER — Other Ambulatory Visit: Payer: Self-pay

## 2018-11-15 DIAGNOSIS — G8929 Other chronic pain: Secondary | ICD-10-CM

## 2018-11-15 DIAGNOSIS — R1013 Epigastric pain: Secondary | ICD-10-CM

## 2018-11-15 DIAGNOSIS — R748 Abnormal levels of other serum enzymes: Secondary | ICD-10-CM

## 2018-11-15 MED ORDER — IOPAMIDOL (ISOVUE-300) INJECTION 61%
125.0000 mL | Freq: Once | INTRAVENOUS | Status: AC | PRN
  Administered 2018-11-15: 13:00:00 125 mL via INTRAVENOUS

## 2018-11-21 ENCOUNTER — Ambulatory Visit (INDEPENDENT_AMBULATORY_CARE_PROVIDER_SITE_OTHER): Payer: Self-pay | Admitting: Family Medicine

## 2018-11-22 ENCOUNTER — Encounter: Payer: Self-pay | Admitting: Family Medicine

## 2019-04-26 ENCOUNTER — Other Ambulatory Visit: Payer: Self-pay

## 2019-04-26 ENCOUNTER — Encounter (HOSPITAL_COMMUNITY): Payer: Self-pay

## 2019-04-26 ENCOUNTER — Emergency Department (HOSPITAL_COMMUNITY): Payer: No Typology Code available for payment source

## 2019-04-26 ENCOUNTER — Emergency Department (HOSPITAL_COMMUNITY)
Admission: EM | Admit: 2019-04-26 | Discharge: 2019-04-26 | Disposition: A | Discharge status: Home / Self Care | Payer: No Typology Code available for payment source | Attending: Emergency Medicine | Admitting: Emergency Medicine

## 2019-04-26 DIAGNOSIS — Y92410 Unspecified street and highway as the place of occurrence of the external cause: Secondary | ICD-10-CM | POA: Diagnosis not present

## 2019-04-26 DIAGNOSIS — Z79899 Other long term (current) drug therapy: Secondary | ICD-10-CM | POA: Diagnosis not present

## 2019-04-26 DIAGNOSIS — S161XXA Strain of muscle, fascia and tendon at neck level, initial encounter: Secondary | ICD-10-CM | POA: Diagnosis not present

## 2019-04-26 DIAGNOSIS — S6992XA Unspecified injury of left wrist, hand and finger(s), initial encounter: Secondary | ICD-10-CM | POA: Insufficient documentation

## 2019-04-26 DIAGNOSIS — Y999 Unspecified external cause status: Secondary | ICD-10-CM | POA: Diagnosis not present

## 2019-04-26 DIAGNOSIS — S199XXA Unspecified injury of neck, initial encounter: Secondary | ICD-10-CM | POA: Diagnosis present

## 2019-04-26 DIAGNOSIS — Y939 Activity, unspecified: Secondary | ICD-10-CM | POA: Insufficient documentation

## 2019-04-26 MED ORDER — IBUPROFEN 800 MG PO TABS: 800.0000 mg | ORAL_TABLET | Freq: Three times a day (TID) | ORAL | 0 refills | Status: AC | PRN

## 2019-04-26 MED ORDER — CYCLOBENZAPRINE HCL 10 MG PO TABS: 10.0000 mg | ORAL_TABLET | Freq: Two times a day (BID) | ORAL | 0 refills | Status: AC | PRN

## 2019-04-26 MED ORDER — KETOROLAC TROMETHAMINE 60 MG/2ML IM SOLN
60.0000 mg | Freq: Once | INTRAMUSCULAR | Status: AC
  Administered 2019-04-26: 60 mg via INTRAMUSCULAR
  Filled 2019-04-26: qty 2
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
## 2019-04-26 NOTE — Discharge Instructions (Signed)
Your left wrist discomfort at base of thumb in conjunction with mechanism of injury is consistent with possible scaphoid fracture.  There is no obvious fracture on plain films, however I would like for you to wear your thumb spica splint and follow-up with orthopedics for ongoing evaluation management.  Please call the orthopedist regarding today's encounter and for ongoing evaluation and management of your wrist injury.  Please take your medications, as prescribed.  You were given a prescription for Flexeril which is a muscle relaxer.  You should not drive, work, consume alcohol, or operate machinery while taking this medication as it can make you very drowsy.  Please discontinue your ibuprofen should you become pregnant or start breast-feeding.  Return to the ED or seek immediate medical attention should you experience any new or worsening symptoms.
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
## 2019-04-26 NOTE — ED Notes (Signed)
Patient verbalizes understanding of discharge instructions. Opportunity for questioning and answers were provided. Armband removed by staff, pt discharged from ED.  

## 2021-07-07 ENCOUNTER — Encounter (HOSPITAL_COMMUNITY): Payer: Self-pay | Admitting: Emergency Medicine

## 2021-07-07 ENCOUNTER — Emergency Department (HOSPITAL_COMMUNITY): Payer: Self-pay

## 2021-07-07 ENCOUNTER — Emergency Department (HOSPITAL_COMMUNITY)
Admission: EM | Admit: 2021-07-07 | Discharge: 2021-07-07 | Disposition: A | Discharge status: Home / Self Care | Payer: Self-pay | Attending: Emergency Medicine | Admitting: Emergency Medicine

## 2021-07-07 DIAGNOSIS — R079 Chest pain, unspecified: Secondary | ICD-10-CM

## 2021-07-07 DIAGNOSIS — R1084 Generalized abdominal pain: Secondary | ICD-10-CM | POA: Insufficient documentation

## 2021-07-07 DIAGNOSIS — R0789 Other chest pain: Secondary | ICD-10-CM | POA: Insufficient documentation

## 2021-07-07 LAB — CBC
HCT: 39.4 % (ref 36.0–46.0)
Hemoglobin: 12.7 g/dL (ref 12.0–15.0)
MCH: 25.8 pg — ABNORMAL LOW (ref 26.0–34.0)
MCHC: 32.2 g/dL (ref 30.0–36.0)
MCV: 80.1 fL (ref 80.0–100.0)
Platelets: 354 10*3/uL (ref 150–400)
RBC: 4.92 MIL/uL (ref 3.87–5.11)
RDW: 16.1 % — ABNORMAL HIGH (ref 11.5–15.5)
WBC: 15.5 10*3/uL — ABNORMAL HIGH (ref 4.0–10.5)
nRBC: 0 % (ref 0.0–0.2)

## 2021-07-07 LAB — COMPREHENSIVE METABOLIC PANEL
ALT: 14 U/L (ref 0–44)
AST: 17 U/L (ref 15–41)
Albumin: 3.6 g/dL (ref 3.5–5.0)
Alkaline Phosphatase: 102 U/L (ref 38–126)
Anion gap: 4 — ABNORMAL LOW (ref 5–15)
BUN: 13 mg/dL (ref 6–20)
CO2: 23 mmol/L (ref 22–32)
Calcium: 8.4 mg/dL — ABNORMAL LOW (ref 8.9–10.3)
Chloride: 110 mmol/L (ref 98–111)
Creatinine, Ser: 0.98 mg/dL (ref 0.44–1.00)
GFR, Estimated: >60 mL/min (ref >60)
Glucose, Bld: 143 mg/dL — ABNORMAL HIGH (ref 70–99)
Potassium: 3.1 mmol/L — ABNORMAL LOW (ref 3.5–5.1)
Sodium: 137 mmol/L (ref 135–145)
Total Bilirubin: 0.5 mg/dL (ref 0.3–1.2)
Total Protein: 7.4 g/dL (ref 6.5–8.1)

## 2021-07-07 LAB — LIPASE, BLOOD: Lipase: 40 U/L (ref 11–51)

## 2021-07-07 LAB — I-STAT BETA HCG BLOOD, ED (MC, WL, AP ONLY): I-stat hCG, quantitative: <5 m[IU]/mL (ref <5)

## 2021-07-07 MED ORDER — FAMOTIDINE IN NACL 20-0.9 MG/50ML-% IV SOLN
20.0000 mg | Freq: Once | INTRAVENOUS | Status: AC
  Administered 2021-07-07: 20 mg via INTRAVENOUS
  Filled 2021-07-07: qty 50

## 2021-07-07 MED ORDER — SODIUM CHLORIDE 0.9 % IV BOLUS
1000.0000 mL | Freq: Once | INTRAVENOUS | Status: AC
  Administered 2021-07-07: 1000 mL via INTRAVENOUS

## 2021-07-07 MED ORDER — OMEPRAZOLE 20 MG PO CPDR: 20.0000 mg | DELAYED_RELEASE_CAPSULE | Freq: Every day | ORAL | 1 refills | Status: AC

## 2021-07-07 MED ORDER — FENTANYL CITRATE PF 50 MCG/ML IJ SOSY
50.0000 ug | PREFILLED_SYRINGE | Freq: Once | INTRAMUSCULAR | Status: AC
  Administered 2021-07-07: 50 ug via INTRAVENOUS
  Filled 2021-07-07: qty 1

## 2021-07-07 MED ORDER — SUCRALFATE 1 G PO TABS: 1.0000 g | ORAL_TABLET | Freq: Four times a day (QID) | ORAL | 0 refills | Status: AC | PRN

## 2021-07-07 MED ORDER — ALUM & MAG HYDROXIDE-SIMETH 200-200-20 MG/5ML PO SUSP
30.0000 mL | Freq: Once | ORAL | Status: AC
  Administered 2021-07-07: 30 mL via ORAL
  Filled 2021-07-07: qty 30

## 2021-07-07 MED ORDER — ONDANSETRON HCL 4 MG/2ML IJ SOLN
4.0000 mg | Freq: Once | INTRAMUSCULAR | Status: AC
  Administered 2021-07-07: 4 mg via INTRAVENOUS
  Filled 2021-07-07: qty 2

## 2021-07-07 NOTE — ED Provider Notes (Signed)
[facility] Emergency Department Provider Note MRN:  [id number]  Arrival date & time: 07/07/21     Chief Complaint   Chest Pain   History of Present Illness   Jenna Brennan is a 31 y.o. year-old female with no pertinent past medical presenting to the ED with chief complaint of chest pain.  Pain in the chest for the past 3 days, on and off, worse at night, described as a sharp pressure.  Symptoms on the right, sometimes on the left.  Pain sometimes in the abdomen as well, diffusely.  Pain is described as severe, 10 out of 10.  Is never happened before.  Denies history of heart or lung problems, no blood clots, no birth control pills, no leg pain or swelling.  No nausea vomiting or diarrhea, no shortness of breath.  Review of Systems  A thorough review of systems was obtained and all systems are negative except as noted in the HPI and PMH.   Patient's Health History   History reviewed. No pertinent past medical history.  Past Surgical History:  Procedure Laterality Date   HERNIA REPAIR      History reviewed. No pertinent family history.  Social History   Socioeconomic History   Marital status: Single    Spouse name: Not on file   Number of children: Not on file   Years of education: Not on file   Highest education level: Not on file  Occupational History   Not on file  Tobacco Use   Smoking status: Never   Smokeless tobacco: Never  Vaping Use   Vaping Use: Never used  Substance and Sexual Activity   Alcohol use: No   Drug use: No   Sexual activity: Yes  Other Topics Concern   Not on file  Social History Narrative   Not on file   Social Determinants of Health   Financial Resource Strain: Not on file  Food Insecurity: Not on file  Transportation Needs: Not on file  Physical Activity: Not on file  Stress: Not on file  Social Connections: Not on file  Intimate Partner Violence: Not on file     Physical Exam   Vitals:   07/07/21 0343  07/07/21 0430  BP: (!) 133/95 112/81  Pulse: 84 72  Resp: (!) 21 (!) 22  Temp: 97.8 F (36.6 C)   SpO2: 100% 100%    CONSTITUTIONAL: Well-appearing, NAD NEURO/PSYCH:  Alert and oriented x 3, no focal deficits EYES:  eyes equal and reactive ENT/NECK:  no LAD, no JVD CARDIO: Regular rate, well-perfused, normal S1 and S2 PULM:  CTAB no wheezing or rhonchi GI/GU:  non-distended, non-tender MSK/SPINE:  No gross deformities, no edema SKIN:  no rash, atraumatic   *Additional and/or pertinent findings included in MDM below  Diagnostic and Interventional Summary    EKG Interpretation  Date/Time:  Monday July 07 2021 03:42:30 EDT Ventricular Rate:  83 PR Interval:  197 QRS Duration: 87 QT Interval:  372 QTC Calculation: 438 R Axis:   54 Text Interpretation: Sinus rhythm Confirmed by Kennis CarinaBero, Neo Yepiz [phone]([id number]) on 07/07/2021 3:44:50 AM       Labs Reviewed  CBC - Abnormal; Notable for the following components:      Result Value   WBC 15.5 (*)    MCH 25.8 (*)    RDW 16.1 (*)    All other components within normal limits  COMPREHENSIVE METABOLIC PANEL - Abnormal; Notable for the following components:   Potassium 3.1 (*)  Glucose, Bld 143 (*)    Calcium 8.4 (*)    Anion gap 4 (*)    All other components within normal limits  LIPASE, BLOOD  I-STAT BETA HCG BLOOD, ED ([facility], [facility], [facility] ONLY)    DG Abdomen Acute W/Chest  Final Result      Medications  ondansetron (ZOFRAN) injection 4 mg (4 mg Intravenous Given 07/07/21 0406)  fentaNYL (SUBLIMAZE) injection 50 mcg (50 mcg Intravenous Given 07/07/21 0406)  famotidine (PEPCID) IVPB 20 mg premix (0 mg Intravenous Stopped 07/07/21 0436)  sodium chloride 0.9 % bolus 1,000 mL (0 mLs Intravenous Stopped 07/07/21 0516)  alum & mag hydroxide-simeth (MAALOX/MYLANTA) 200-200-20 MG/5ML suspension 30 mL (30 mLs Oral Given 07/07/21 0406)     Procedures  /  Critical Care Procedures  ED Course and Medical Decision Making  Initial Impression and  Ddx Differential diagnosis includes GERD, MSK pain, pleurisy, overall low concern for significant cardiopulmonary process, PERC negative, no cardiovascular risk factors.  Abdomen is soft and nontender, no rebound guarding or rigidity.  She endorses pain as 10 out of 10 however she sits comfortably and calmly in bed.  Obtaining screening labs, x-ray, will reassess.  Past medical/surgical history that increases complexity of ED encounter: None  Interpretation of Diagnostics I personally reviewed the EKG and my interpretation is as follows: Sinus rhythm  Labs are reassuring with no significant blood count or electrolyte disturbance, normal renal and liver function, plain films reassuring with no obvious pneumothorax or widening of the mediastinum or signs of intestinal obstruction or free air.  Patient Reassessment and Ultimate Disposition/Management     On reassessment patient's pain is improved, continued reassuring vital signs and abdominal exam.  Shared decision making utilized and CT imaging offered, with the alternative plan of starting some GERD medications and monitoring symptoms at home.  We decided to defer CT imaging for now, trial PPI and Carafate at home, strict return precautions.  Patient management required discussion with the following services or consulting groups:  None  Complexity of Problems Addressed Acute illness or injury that poses threat of life of bodily function  Additional Data Reviewed and Analyzed Further history obtained from: Further history from spouse/family member  Additional Factors Impacting ED Encounter Risk Prescriptions  Elmer Sow. Pilar Plate, MD [facility] Emergency Medicine [facility] [email or web address]  Final Clinical Impressions(s) / ED Diagnoses     ICD-10-CM   1. Chest pain, unspecified type  R07.9     2. Generalized abdominal pain  R10.84       ED Discharge Orders          Ordered    omeprazole (PRILOSEC) 20 MG  capsule  Daily        07/07/21 0528    sucralfate (CARAFATE) 1 g tablet  4 times daily PRN        07/07/21 0528             Discharge Instructions Discussed with and Provided to Patient:     Discharge Instructions      You were evaluated in the Emergency Department and after careful evaluation, we did not find any emergent condition requiring admission or further testing in the hospital.  Your exam/testing today is overall reassuring.  Symptoms may be due to acid reflux or inflammation of the lining of the stomach.  Recommend taking the omeprazole daily to prevent pain in the Carafate medicine as needed for immediate relief.  Recommend follow-up with a primary care doctor.  Please return  to the Emergency Department if you experience any worsening of your condition.   Thank you for allowing [place] to be a part of your care.       Sabas Sous, MD 07/07/21 [phone]

## 2021-07-07 NOTE — Discharge Instructions (Signed)
You were evaluated in the Emergency Department and after careful evaluation, we did not find any emergent condition requiring admission or further testing in the hospital.  Your exam/testing today is overall reassuring.  Symptoms may be due to acid reflux or inflammation of the lining of the stomach.  Recommend taking the omeprazole daily to prevent pain in the Carafate medicine as needed for immediate relief.  Recommend follow-up with a primary care doctor.  Please return to the Emergency Department if you experience any worsening of your condition.   Thank you for allowing [place] to be a part of your care.

## 2021-07-07 NOTE — ED Triage Notes (Signed)
Pt c/o chest pressure that goes from her chest to her sides, then goes to her abd then to back. Pt states it has been going on for the past 3 nights. States pain comes and goes.

## 2022-12-20 ENCOUNTER — Encounter (HOSPITAL_COMMUNITY): Payer: Self-pay | Admitting: *Deleted

## 2022-12-20 ENCOUNTER — Other Ambulatory Visit: Payer: Self-pay

## 2022-12-20 ENCOUNTER — Emergency Department (HOSPITAL_COMMUNITY)
Admission: EM | Admit: 2022-12-20 | Discharge: 2022-12-20 | Disposition: A | Discharge status: Home / Self Care | Payer: Commercial Managed Care - HMO | Attending: Student | Admitting: Student

## 2022-12-20 DIAGNOSIS — M79644 Pain in right finger(s): Secondary | ICD-10-CM | POA: Diagnosis present

## 2022-12-20 DIAGNOSIS — L03011 Cellulitis of right finger: Secondary | ICD-10-CM | POA: Diagnosis not present

## 2022-12-20 MED ORDER — NAPROXEN 250 MG PO TABS
500.0000 mg | ORAL_TABLET | Freq: Once | ORAL | Status: AC
  Administered 2022-12-20: 500 mg via ORAL
  Filled 2022-12-20: qty 2

## 2022-12-20 MED ORDER — SULFAMETHOXAZOLE-TRIMETHOPRIM 800-160 MG PO TABS: 1.0000 | ORAL_TABLET | Freq: Two times a day (BID) | ORAL | 0 refills | Status: AC

## 2022-12-20 MED ORDER — SULFAMETHOXAZOLE-TRIMETHOPRIM 800-160 MG PO TABS
1.0000 | ORAL_TABLET | Freq: Once | ORAL | Status: AC
  Administered 2022-12-20: 1 via ORAL
  Filled 2022-12-20: qty 1

## 2022-12-20 NOTE — ED Provider Notes (Signed)
[facility] Provider Note   CSN: [id number] Arrival date & time: 12/20/22  1706     History  Chief Complaint  Patient presents with   Finger pain    Jenna Brennan is a 32 y.o. female with no significant past medical history presents the ED today for finger pain.  Patient reports pain the nail around her right index finger for the past 6 days.  She has redness and swelling around her fingernail.  Pain is located to the tip of her finger.  She has tried using hydrogen peroxide and Neosporin without relief.  She maintains range of motion of her finger, sensation, and strength.  No fevers at home.  She reports biting her fingernails but denies any trauma or injury to the finger.  No additional complaints or concerns at this time.    Home Medications Prior to Admission medications   Medication Sig Start Date End Date Taking? Authorizing Provider  sulfamethoxazole-trimethoprim (BACTRIM DS) 800-160 MG tablet Take 1 tablet by mouth 2 (two) times daily for 7 days. 12/20/22 12/27/22 Yes Maxwell Marion, PA-C  cyclobenzaprine (FLEXERIL) 10 MG tablet Take 1 tablet (10 mg total) by mouth 2 (two) times daily as needed for muscle spasms. 04/26/19   Lorelee New, PA-C  ibuprofen (ADVIL) 800 MG tablet Take 1 tablet (800 mg total) by mouth 3 (three) times daily as needed for moderate pain. 04/26/19   Lorelee New, PA-C  omeprazole (PRILOSEC) 20 MG capsule Take 1 capsule (20 mg total) by mouth daily. 07/07/21   Sabas Sous, MD  sucralfate (CARAFATE) 1 g tablet Take 1 tablet (1 g total) by mouth 4 (four) times daily as needed. 07/07/21   Sabas Sous, MD      Allergies    Patient has no known allergies.    Review of Systems   Review of Systems  Musculoskeletal:        Right index finger pain  All other systems reviewed and are negative.   Physical Exam Updated Vital Signs BP 124/70 (BP Location: Left Arm)   Pulse 91   Temp 98.6 F (37 C)  (Oral)   Resp 16   Ht 5\' 5"  (1.651 m)   Wt 116.1 kg   LMP 12/01/2022   SpO2 100%   BMI 42.60 kg/m  Physical Exam Vitals and nursing note reviewed.  Constitutional:      General: She is not in acute distress.    Appearance: Normal appearance.  HENT:     Head: Normocephalic and atraumatic.     Mouth/Throat:     Mouth: Mucous membranes are moist.  Eyes:     Conjunctiva/sclera: Conjunctivae normal.     Pupils: Pupils are equal, round, and reactive to light.  Cardiovascular:     Rate and Rhythm: Normal rate and regular rhythm.     Pulses: Normal pulses.     Heart sounds: Normal heart sounds.  Pulmonary:     Effort: Pulmonary effort is normal.     Breath sounds: Normal breath sounds.  Musculoskeletal:        General: Tenderness present.     Comments: Erythema to the tip of the right index finger and tenderness to palpation of the lateral nailbed. No fluctuance appreciated on palpation.  Tenderness is not extend past the DIP.  No swelling present at the pad of the finger. Capillary refill remains intact.  Range of motion, strength, and sensation remains intact. Palpable radial pulse.  Skin:    General: Skin is warm and dry.     Findings: Erythema present. No rash.  Neurological:     General: No focal deficit present.     Mental Status: She is alert.  Psychiatric:        Mood and Affect: Mood normal.        Behavior: Behavior normal.    ED Results / Procedures / Treatments   Labs (all labs ordered are listed, but only abnormal results are displayed) Labs Reviewed - No data to display  EKG None  Radiology No results found.  Procedures Procedures: not indicated.   Medications Ordered in ED Medications  naproxen (NAPROSYN) tablet 500 mg (has no administration in time range)  sulfamethoxazole-trimethoprim (BACTRIM DS) 800-160 MG per tablet 1 tablet (has no administration in time range)    ED Course/ Medical Decision Making/ A&P                                  Medical Decision Making Risk Prescription drug management.   This patient presents to the ED for concern of finger pain, this involves an extensive number of treatment options, and is a complaint that carries with it a high risk of complications and morbidity.   Differential diagnosis includes: fracture, dislocation, contusion, paronychia, felon, cellulitis, etc.   Comorbidities  See HPI above   Additional History  Additional history obtained from prior records.   Problem List / ED Course / Critical Interventions / Medication Management  Right index finger pain I ordered medications including: Naproxen for pain First dose of Bactrim given in the ED today Medications given prior to discharge I have reviewed the patients home medicines and have made adjustments as needed   Social Determinants of Health  Access to healthcare   Test / Admission - Considered  Discussed findings with patient. Stable and safe discharge home. Return precautions given.       Final Clinical Impression(s) / ED Diagnoses Final diagnoses:  Paronychia of finger of right hand    Rx / DC Orders ED Discharge Orders          Ordered    sulfamethoxazole-trimethoprim (BACTRIM DS) 800-160 MG tablet  2 times daily        12/20/22 1826              Maxwell Marion, PA-C 12/20/22 1839    Glendora Score, MD 12/21/22 1217

## 2022-12-20 NOTE — ED Triage Notes (Signed)
Pt with finger pain around nail to right index finger since Tuesday.

## 2022-12-20 NOTE — Discharge Instructions (Signed)
Discussed, you most likely have a paronychia.  Can alternate between Ibuprofen and Tylenol for pain every 4 hours as needed. Take Bactrim 2x a day for the next 7 days.  Follow up with your PCP in 5-7 days for reevaluation of your symptoms.  Return to the ED if: you develop a fever or red streaking down your finger.

## 2022-12-20 NOTE — ED Notes (Signed)
Pt walked from lobby to ED room 23 Pt complains of nail pain RIGHT hand 2nd digit Pt state it has been going on for 1 week

## 2023-11-21 IMAGING — DX DG ABDOMEN ACUTE W/ 1V CHEST
4 series · 4 of 4 positions shown · non-contrast
Comparison: 02/27/2018 chest radiograph

CLINICAL DATA: Chest and abdominal pain

EXAM:
DG ABDOMEN ACUTE WITH 1 VIEW CHEST

[chest pa]
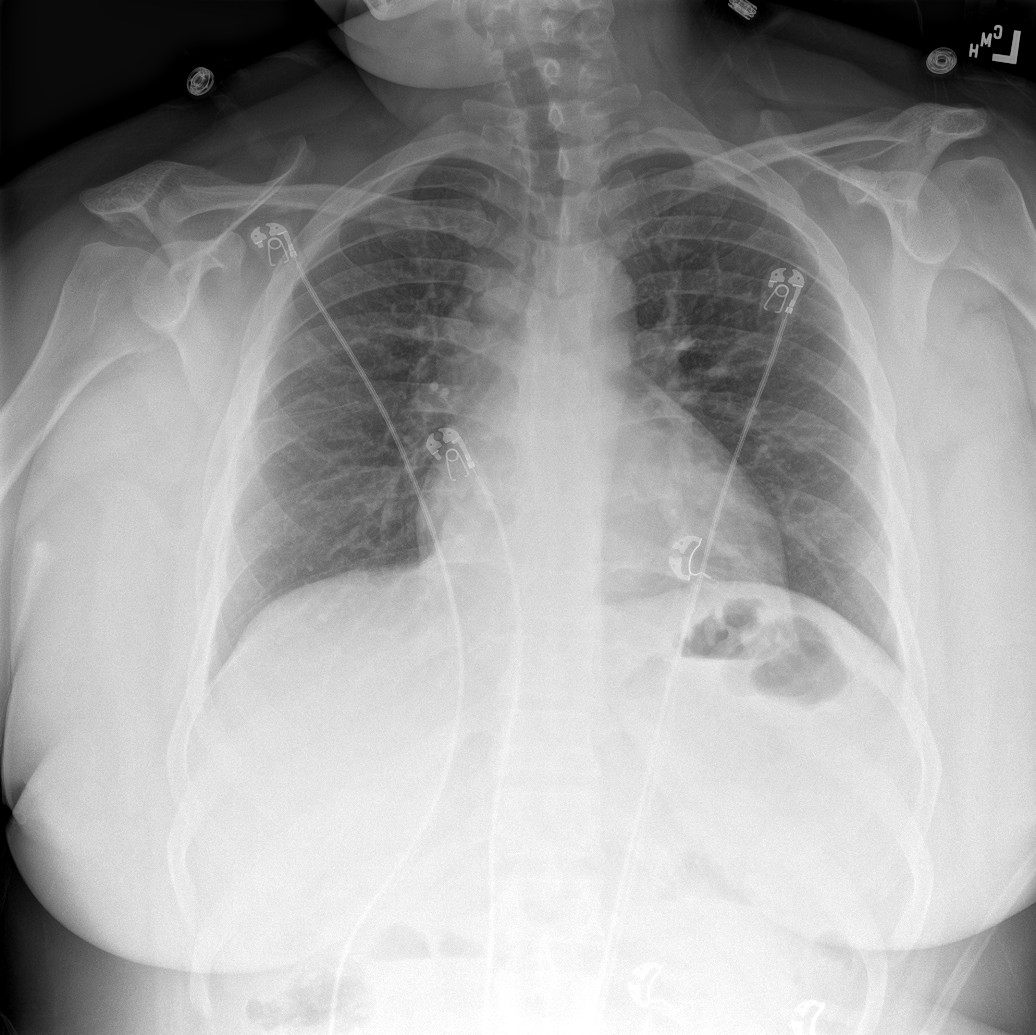

[abdomen erect]
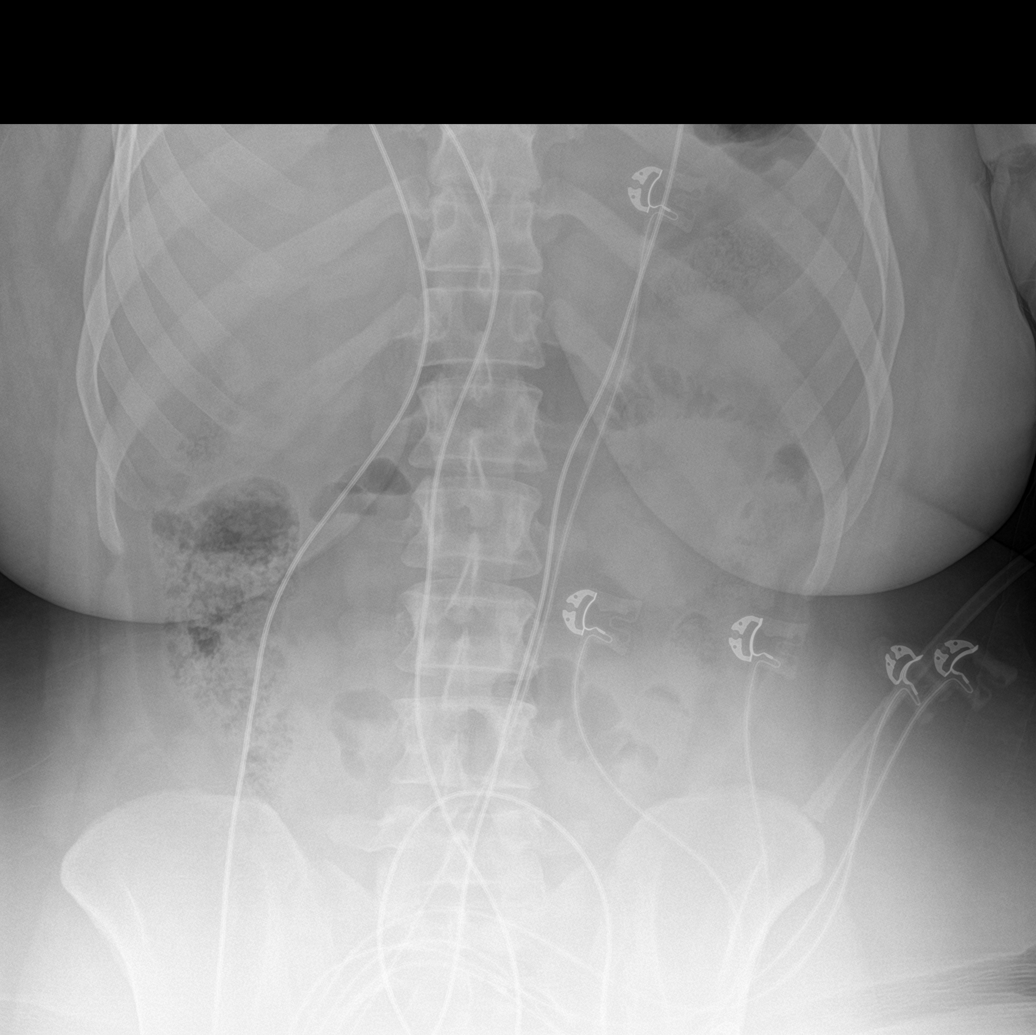

[abdomen supine (1 of 2)]
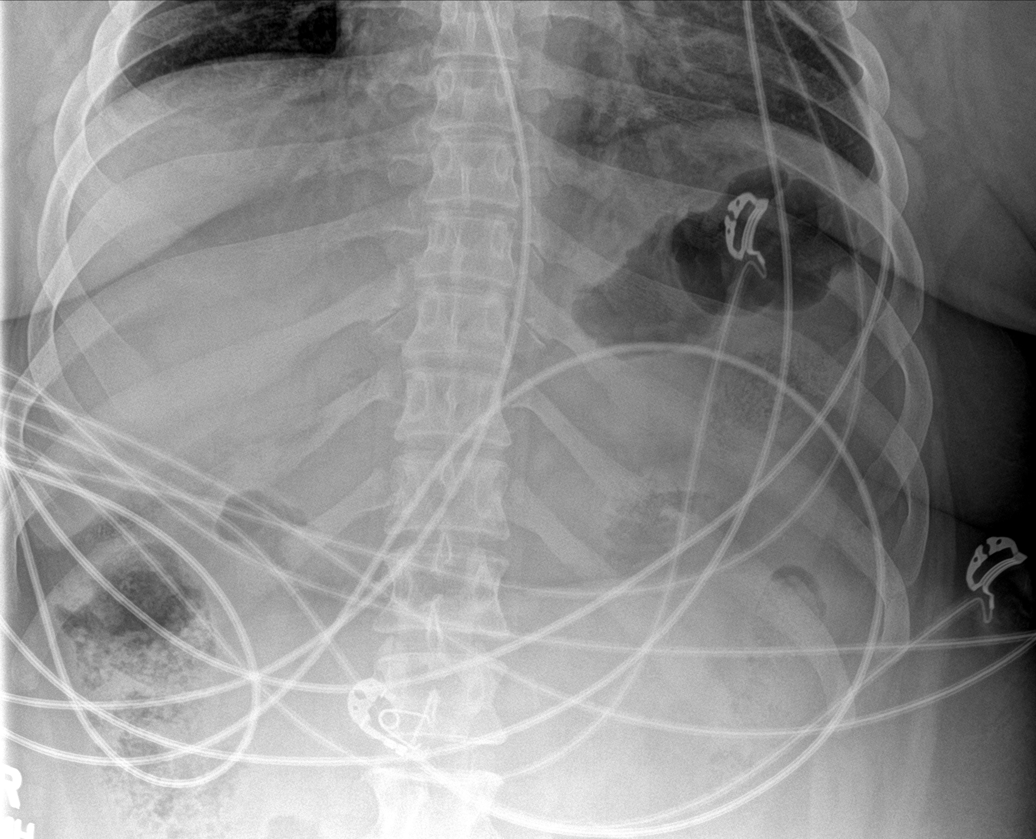

[abdomen supine (2 of 2)]
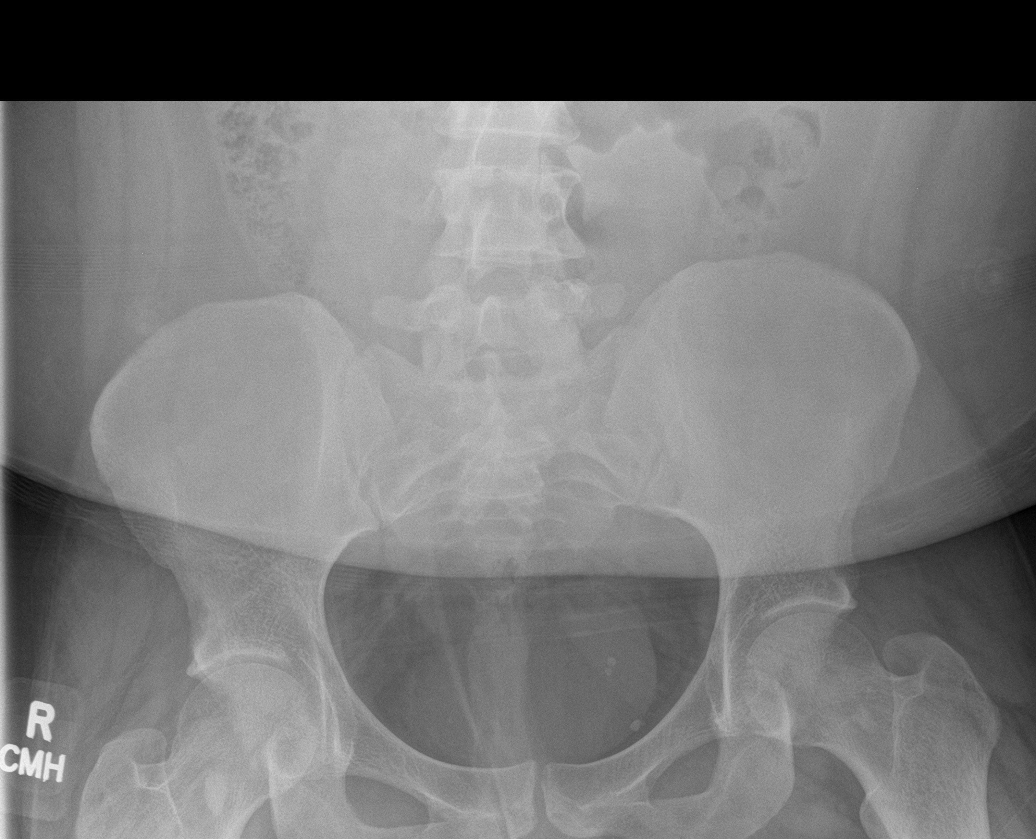

[4 of 4 positions shown; findings below may reference images not displayed]

FINDINGS: Artifact from EKG leads.

There is no edema, consolidation, effusion, or pneumothorax. Normal
heart size and mediastinal contours.

Normal bowel gas pattern. No abnormal stool retention. Left pelvic
calcifications consistent with phleboliths seen on 0808 abdominal
CT.
IMPRESSION: Negative abdominal radiographs.  No acute cardiopulmonary disease.
